# Patient Record
Sex: Female | Born: 1935 | Race: White | Hispanic: No
Health system: Southern US, Community
[De-identification: ages and names within clinical notes are randomized; demographics above are authoritative.]

## PROBLEM LIST (undated history)

## (undated) DIAGNOSIS — R262 Difficulty in walking, not elsewhere classified: Secondary | ICD-10-CM

## (undated) DIAGNOSIS — F329 Major depressive disorder, single episode, unspecified: Secondary | ICD-10-CM

## (undated) DIAGNOSIS — F32A Depression, unspecified: Secondary | ICD-10-CM

## (undated) DIAGNOSIS — K2970 Gastritis, unspecified, without bleeding: Secondary | ICD-10-CM

## (undated) DIAGNOSIS — R627 Adult failure to thrive: Secondary | ICD-10-CM

## (undated) DIAGNOSIS — K219 Gastro-esophageal reflux disease without esophagitis: Secondary | ICD-10-CM

## (undated) DIAGNOSIS — M6281 Muscle weakness (generalized): Secondary | ICD-10-CM

## (undated) DIAGNOSIS — E871 Hypo-osmolality and hyponatremia: Secondary | ICD-10-CM

---

## 2017-01-23 ENCOUNTER — Encounter (HOSPITAL_COMMUNITY): Payer: Self-pay | Admitting: *Deleted

## 2017-01-23 ENCOUNTER — Emergency Department (HOSPITAL_COMMUNITY): Payer: Medicare Other

## 2017-01-23 ENCOUNTER — Inpatient Hospital Stay (HOSPITAL_COMMUNITY)
Admission: EM | Admit: 2017-01-23 | Discharge: 2017-01-28 | DRG: 064 | Disposition: A | Discharge status: Hospice | Payer: Medicare Other | Attending: Family Medicine | Admitting: Family Medicine

## 2017-01-23 DIAGNOSIS — G92 Toxic encephalopathy: Secondary | ICD-10-CM | POA: Diagnosis present

## 2017-01-23 DIAGNOSIS — I639 Cerebral infarction, unspecified: Secondary | ICD-10-CM | POA: Diagnosis present

## 2017-01-23 DIAGNOSIS — R4182 Altered mental status, unspecified: Secondary | ICD-10-CM | POA: Diagnosis present

## 2017-01-23 DIAGNOSIS — F329 Major depressive disorder, single episode, unspecified: Secondary | ICD-10-CM | POA: Diagnosis present

## 2017-01-23 DIAGNOSIS — I63511 Cerebral infarction due to unspecified occlusion or stenosis of right middle cerebral artery: Principal | ICD-10-CM | POA: Diagnosis present

## 2017-01-23 DIAGNOSIS — Z515 Encounter for palliative care: Secondary | ICD-10-CM | POA: Diagnosis present

## 2017-01-23 DIAGNOSIS — Z66 Do not resuscitate: Secondary | ICD-10-CM | POA: Diagnosis present

## 2017-01-23 DIAGNOSIS — F0280 Dementia in other diseases classified elsewhere without behavioral disturbance: Secondary | ICD-10-CM | POA: Diagnosis present

## 2017-01-23 DIAGNOSIS — E7220 Disorder of urea cycle metabolism, unspecified: Secondary | ICD-10-CM | POA: Diagnosis present

## 2017-01-23 DIAGNOSIS — Z823 Family history of stroke: Secondary | ICD-10-CM

## 2017-01-23 DIAGNOSIS — A419 Sepsis, unspecified organism: Secondary | ICD-10-CM | POA: Diagnosis not present

## 2017-01-23 DIAGNOSIS — R627 Adult failure to thrive: Secondary | ICD-10-CM | POA: Diagnosis present

## 2017-01-23 DIAGNOSIS — K219 Gastro-esophageal reflux disease without esophagitis: Secondary | ICD-10-CM | POA: Diagnosis present

## 2017-01-23 DIAGNOSIS — I10 Essential (primary) hypertension: Secondary | ICD-10-CM | POA: Diagnosis present

## 2017-01-23 DIAGNOSIS — F1097 Alcohol use, unspecified with alcohol-induced persisting dementia: Secondary | ICD-10-CM | POA: Diagnosis present

## 2017-01-23 DIAGNOSIS — I63421 Cerebral infarction due to embolism of right anterior cerebral artery: Secondary | ICD-10-CM | POA: Diagnosis not present

## 2017-01-23 HISTORY — DX: Gastro-esophageal reflux disease without esophagitis: K21.9

## 2017-01-23 HISTORY — DX: Adult failure to thrive: R62.7

## 2017-01-23 HISTORY — DX: Major depressive disorder, single episode, unspecified: F32.9

## 2017-01-23 HISTORY — DX: Gastritis, unspecified, without bleeding: K29.70

## 2017-01-23 HISTORY — DX: Difficulty in walking, not elsewhere classified: R26.2

## 2017-01-23 HISTORY — DX: Muscle weakness (generalized): M62.81

## 2017-01-23 HISTORY — DX: Hypo-osmolality and hyponatremia: E87.1

## 2017-01-23 HISTORY — DX: Depression, unspecified: F32.A

## 2017-01-23 LAB — I-STAT CG4 LACTIC ACID, ED
Lactic Acid, Venous: 2.82 mmol/L (ref 0.5–1.9)
Lactic Acid, Venous: 3.73 mmol/L (ref 0.5–1.9)

## 2017-01-23 LAB — CBC WITH DIFFERENTIAL/PLATELET
Basophils Absolute: 0 10*3/uL (ref 0.0–0.1)
Basophils Relative: 0 %
EOS PCT: 0 %
Eosinophils Absolute: 0 10*3/uL (ref 0.0–0.7)
HEMATOCRIT: 32.6 % — AB (ref 36.0–46.0)
Hemoglobin: 10.5 g/dL — ABNORMAL LOW (ref 12.0–15.0)
LYMPHS ABS: 0.8 10*3/uL (ref 0.7–4.0)
Lymphocytes Relative: 4 %
MCH: 21.7 pg — AB (ref 26.0–34.0)
MCHC: 32.2 g/dL (ref 30.0–36.0)
MCV: 67.5 fL — AB (ref 78.0–100.0)
MONO ABS: 1.4 10*3/uL — AB (ref 0.1–1.0)
Monocytes Relative: 7 %
NEUTROS PCT: 89 %
Neutro Abs: 17.1 10*3/uL — ABNORMAL HIGH (ref 1.7–7.7)
PLATELETS: 175 10*3/uL (ref 150–400)
RBC: 4.83 MIL/uL (ref 3.87–5.11)
RDW: 22.4 % — AB (ref 11.5–15.5)
WBC: 19.3 10*3/uL — AB (ref 4.0–10.5)

## 2017-01-23 LAB — I-STAT ARTERIAL BLOOD GAS, ED
Acid-base deficit: 6 mmol/L — ABNORMAL HIGH (ref 0.0–2.0)
Bicarbonate: 16.7 mmol/L — ABNORMAL LOW (ref 20.0–28.0)
O2 SAT: 100 %
PCO2 ART: 26.5 mmHg — AB (ref 32.0–48.0)
PH ART: 7.417 (ref 7.350–7.450)
PO2 ART: 279 mmHg — AB (ref 83.0–108.0)
Patient temperature: 103.4
TCO2: 17 mmol/L — ABNORMAL LOW (ref 22–32)

## 2017-01-23 LAB — I-STAT CHEM 8, ED
BUN: 52 mg/dL — AB (ref 6–20)
CALCIUM ION: 1 mmol/L — AB (ref 1.15–1.40)
CREATININE: 0.8 mg/dL (ref 0.44–1.00)
Chloride: 115 mmol/L — ABNORMAL HIGH (ref 101–111)
Glucose, Bld: 133 mg/dL — ABNORMAL HIGH (ref 65–99)
HCT: 32 % — ABNORMAL LOW (ref 36.0–46.0)
HEMOGLOBIN: 10.9 g/dL — AB (ref 12.0–15.0)
Potassium: 3.7 mmol/L (ref 3.5–5.1)
SODIUM: 144 mmol/L (ref 135–145)
TCO2: 16 mmol/L — AB (ref 22–32)

## 2017-01-23 LAB — AMMONIA: AMMONIA: 54 umol/L — AB (ref 9–35)

## 2017-01-23 LAB — URINALYSIS, ROUTINE W REFLEX MICROSCOPIC
Bilirubin Urine: NEGATIVE
Glucose, UA: NEGATIVE mg/dL
Ketones, ur: NEGATIVE mg/dL
Nitrite: NEGATIVE
SPECIFIC GRAVITY, URINE: 1.018 (ref 1.005–1.030)
pH: 8 (ref 5.0–8.0)

## 2017-01-23 MED ORDER — ONDANSETRON HCL 4 MG/2ML IJ SOLN: 4.0000 mg | Freq: Four times a day (QID) | INTRAMUSCULAR | Status: DC | PRN

## 2017-01-23 MED ORDER — HALOPERIDOL LACTATE 2 MG/ML PO CONC
0.5000 mg | ORAL | Status: DC | PRN
  Filled 2017-01-23: qty 0.3

## 2017-01-23 MED ORDER — ONDANSETRON 4 MG PO TBDP: 4.0000 mg | ORAL_TABLET | Freq: Four times a day (QID) | ORAL | Status: DC | PRN

## 2017-01-23 MED ORDER — VANCOMYCIN HCL IN DEXTROSE 1-5 GM/200ML-% IV SOLN: 1000.0000 mg | INTRAVENOUS | Status: DC

## 2017-01-23 MED ORDER — BIOTENE DRY MOUTH MT LIQD: 15.0000 mL | OROMUCOSAL | Status: DC | PRN

## 2017-01-23 MED ORDER — HALOPERIDOL LACTATE 5 MG/ML IJ SOLN: 0.5000 mg | INTRAMUSCULAR | Status: DC | PRN

## 2017-01-23 MED ORDER — GLYCOPYRROLATE 0.2 MG/ML IJ SOLN: 0.2000 mg | INTRAMUSCULAR | Status: DC | PRN

## 2017-01-23 MED ORDER — SODIUM CHLORIDE 0.9 % IV BOLUS (SEPSIS)
250.0000 mL | Freq: Once | INTRAVENOUS | Status: AC
  Administered 2017-01-23: 250 mL via INTRAVENOUS

## 2017-01-23 MED ORDER — PIPERACILLIN-TAZOBACTAM 3.375 G IVPB
3.3750 g | Freq: Three times a day (TID) | INTRAVENOUS | Status: DC
  Administered 2017-01-23 – 2017-01-24 (×2): 3.375 g via INTRAVENOUS
  Filled 2017-01-23 (×4): qty 50

## 2017-01-23 MED ORDER — ACETAMINOPHEN 325 MG PO TABS: 650.0000 mg | ORAL_TABLET | Freq: Four times a day (QID) | ORAL | Status: DC | PRN

## 2017-01-23 MED ORDER — PIPERACILLIN-TAZOBACTAM 3.375 G IVPB 30 MIN
3.3750 g | Freq: Once | INTRAVENOUS | Status: AC
  Administered 2017-01-23: 3.375 g via INTRAVENOUS
  Filled 2017-01-23: qty 50

## 2017-01-23 MED ORDER — PIPERACILLIN-TAZOBACTAM 3.375 G IVPB: 3.3750 g | Freq: Three times a day (TID) | INTRAVENOUS | Status: DC

## 2017-01-23 MED ORDER — ACETAMINOPHEN 650 MG RE SUPP
650.0000 mg | Freq: Once | RECTAL | Status: AC
  Administered 2017-01-23: 650 mg via RECTAL
  Filled 2017-01-23: qty 1

## 2017-01-23 MED ORDER — ACETAMINOPHEN 650 MG RE SUPP
650.0000 mg | Freq: Four times a day (QID) | RECTAL | Status: DC | PRN
  Administered 2017-01-24: 650 mg via RECTAL
  Filled 2017-01-23: qty 1

## 2017-01-23 MED ORDER — HALOPERIDOL 1 MG PO TABS: 0.5000 mg | ORAL_TABLET | ORAL | Status: DC | PRN

## 2017-01-23 MED ORDER — SODIUM CHLORIDE 0.9 % IV BOLUS (SEPSIS)
500.0000 mL | Freq: Once | INTRAVENOUS | Status: AC
  Administered 2017-01-23: 500 mL via INTRAVENOUS

## 2017-01-23 MED ORDER — VANCOMYCIN HCL IN DEXTROSE 1-5 GM/200ML-% IV SOLN
1000.0000 mg | Freq: Once | INTRAVENOUS | Status: AC
  Administered 2017-01-23: 1000 mg via INTRAVENOUS
  Filled 2017-01-23: qty 200

## 2017-01-23 MED ORDER — POLYVINYL ALCOHOL 1.4 % OP SOLN
1.0000 [drp] | Freq: Four times a day (QID) | OPHTHALMIC | Status: DC | PRN
  Filled 2017-01-23: qty 15

## 2017-01-23 MED ORDER — SODIUM CHLORIDE 0.9 % IV BOLUS (SEPSIS)
1000.0000 mL | Freq: Once | INTRAVENOUS | Status: AC
  Administered 2017-01-23: 1000 mL via INTRAVENOUS

## 2017-01-23 MED ORDER — GLYCOPYRROLATE 1 MG PO TABS
1.0000 mg | ORAL_TABLET | ORAL | Status: DC | PRN
  Filled 2017-01-23: qty 1

## 2017-01-23 NOTE — Consult Note (Signed)
Neurology Consultation Reason for Consult: Stroke Referring Physician: Corlis Leak, C  CC: Altered mental status  History is obtained from: Referring provider's  HPI: Wanda Manning is a 83 y.o. female with large ICA stroke. History is limited due to patient's altered mental status. I spoke with her son who states that her quality of life baseline is very low, and that she has been losing strength because of inactivity. She was found to be confused at the nursing home and therefore brought in for further evaluation. On arrival, she was obtunded, tachycardic, suspected sepsis. A head CT was obtained which shows a large ICA infarct on the right.   LKW: Unclear tpa given?: no, unclear time of onset   ROS:  Unable to obtain due to altered mental status.   Past Medical History:  Diagnosis Date  . Depression   . Difficulty in walking   . Failure to thrive in adult   . Gastritis   . GERD (gastroesophageal reflux disease)   . Hyponatremia   . Muscle weakness      Family history: Unable to obtain due to AMS   Social History:  has no tobacco, alcohol, and drug history on file.   Exam: Current vital signs: BP (!) 142/91 (BP Location: Right Arm)   Pulse (!) 173   Temp (!) 103.4 F (39.7 C) (Rectal)   Resp (!) 29   Wt 48.1 kg (106 lb)   SpO2 100%  Vital signs in last 24 hours: Temp:  [103.4 F (39.7 C)] 103.4 F (39.7 C) (09/26 1510) Pulse Rate:  [140-173] 173 (09/26 1628) Resp:  [20-40] 29 (09/26 1628) BP: (119-156)/(64-102) 142/91 (09/26 1628) SpO2:  [100 %] 100 % (09/26 1628) Weight:  [48.1 kg (106 lb)] 48.1 kg (106 lb) (09/26 1606)   Physical Exam  Constitutional: Appears  Psych: Affect appropriate to situation Eyes: No scleral injection HENT: Nonrebreather in place Head: Normocephalic.  Cardiovascular: Normal rate and regular rhythm.  Respiratory: Tachypnea GI: Soft.  No distension. There is no tenderness.  Skin: WDI  Neuro: Mental Status: Patient is obtunded, she  does not follow commands. She does open her eyes to noxious stimuli Cranial Nerves: II: She does not blink to threat, right pupil is larger than her left, but both are reactive III,IV, VI: She does not cross midline to the left V: Facial sensation is symmetric to temperature VII: Facial movement with some left weakness VIII: hearing is intact to voice X: Uvula elevates symmetrically XI: Shoulder shrug is symmetric. XII: tongue is midline without atrophy or fasciculations.  Motor: She moves her right side purposefully and well, she has markedly increased tone throughout the left side, but does flex to noxious stimuli Sensory: She response to noxious stimuli in all 4 extremities Cerebellar: Does not perform   I have reviewed labs in epic and the results pertinent to this consultation are: Leukocytosis  I have reviewed the images obtained: CT head-large right ICA infarct  Impression: 83 year old female with dementia and poor quality of life prior to presentation who presents with devastating right ICA stroke. Though the stroke is large, given the degree of atrophy, it is possible that she will not herniate due to this. I discussed with the son that even if she were to survive this, her quality of life would be dramatically reduced to which she responded that her quality of life was not very good even prior.  Given this, discussion was had with both the son and daughter who agree that they wanted  to focus only on comfort care and not pursue any life prolonging measures.  Recommendations: 1) comfort only care 2)  Please call f neurology can be of further assistance   Ritta Slot, MD [facility] [phone]  If 7pm- 7am, please page neurology on call as listed in AMION.

## 2017-01-23 NOTE — ED Notes (Signed)
EDP speaking with family on phone.  Family choosing not to intubate and not to resuscitate.

## 2017-01-23 NOTE — ED Notes (Signed)
Meal tray ordered 

## 2017-01-23 NOTE — ED Notes (Signed)
Rep;ort given to rn on 5c 

## 2017-01-23 NOTE — ED Triage Notes (Signed)
Pt to ED via [facility] from [place] for altered mental status.  Pt with dementia but normally responsive.  Found unresponsive by staff today.  LSN yesterday.  Given 500 ns bolus en-route for hr of 150.  Hr 140 st, rr 40, bp 134/93, sats 100% nrb.  103.4 rectal temp.

## 2017-01-23 NOTE — ED Provider Notes (Signed)
[facility]-EMERGENCY DEPT Provider Note   CSN: [id number] Arrival date & time: 01/23/17  1502     History   Chief Complaint Chief Complaint  Patient presents with  . Altered Mental Status    HPI Wanda Manning is a 83 y.o. female.  The history is provided by the EMS personnel and medical records. No language interpreter was used.    83 year old female history of dementia and spasticity who presents via EMS from living facility for altered mental status.  Last seen at baseline yesterday. Baseline reportedly able to interact and answer some questions. History obtained from EMS. Patient minimally responsive today.  Glucose 179. Tachycardic 150s at nursing facility. Febrile 103.31F on arrival. Tachypneic though SpO2>90% on room air at facility.   Past Medical History:  Diagnosis Date  . Depression   . Difficulty in walking   . Failure to thrive in adult   . Gastritis   . GERD (gastroesophageal reflux disease)   . Hyponatremia   . Muscle weakness     Patient Active Problem List   Diagnosis Date Noted  . Stroke (cerebrum) (HCC) 01/23/2017    No past surgical history on file.  OB History    No data available       Home Medications    Prior to Admission medications   Medication Sig Start Date End Date Taking? Authorizing Provider  acetaminophen (TYLENOL) 325 MG tablet Take 650 mg by mouth every 4 (four) hours as needed for mild pain.   Yes [provider]  Amino Acids-Protein Hydrolys (FEEDING SUPPLEMENT, PRO-STAT SUGAR FREE 64,) LIQD Take 30 mLs by mouth 2 (two) times daily.   Yes [provider]  Cholecalciferol (VITAMIN D) 2000 units tablet Take 2,000 Units by mouth daily.   Yes [provider]  omeprazole (PRILOSEC) 20 MG capsule Take 20 mg by mouth daily.   Yes [provider]  vitamin B-12 (CYANOCOBALAMIN) 1000 MCG tablet Take 1,000 mcg by mouth daily.   Yes [provider]    Family History No family history on  file.  Social History Social History  Substance Use Topics  . Smoking status: Not on file  . Smokeless tobacco: Not on file  . Alcohol use Not on file     Allergies   Patient has no allergy information on record.   Review of Systems Review of Systems  Unable to perform ROS: Patient nonverbal     Physical Exam Updated Vital Signs BP (!) 151/75   Pulse (!) 37   Temp (!) 103.4 F (39.7 C) (Rectal)   Resp (!) 33   Wt 48.1 kg (106 lb)   SpO2 (!) 82%   Physical Exam  Constitutional: She appears well-developed. She appears toxic. She has a sickly appearance. She appears ill.  HENT:  Head: Normocephalic and atraumatic.  Eyes:  Slight anisocoria. R pupil 3mm, L pupil 2mm. Reactive to light bilaterally  Neck: Neck supple.  Cardiovascular: An irregularly irregular rhythm present. Tachycardia present.   No murmur heard. Pulmonary/Chest: Tachypnea noted. She is in respiratory distress.  Agonal respirations  Abdominal: Soft. There is no tenderness.  Musculoskeletal: She exhibits no edema.  Neurological:  Unresponsive to commands or pain  Skin: Skin is warm.  Nursing note and vitals reviewed.    ED Treatments / Results  Labs (all labs ordered are listed, but only abnormal results are displayed) Labs Reviewed  CBC WITH DIFFERENTIAL/PLATELET - Abnormal; Notable for the following:       Result Value  WBC 19.3 (*)    Hemoglobin 10.5 (*)    HCT 32.6 (*)    MCV 67.5 (*)    MCH 21.7 (*)    RDW 22.4 (*)    Neutro Abs 17.1 (*)    Monocytes Absolute 1.4 (*)    All other components within normal limits  URINALYSIS, ROUTINE W REFLEX MICROSCOPIC - Abnormal; Notable for the following:    Color, Urine AMBER (*)    APPearance CLOUDY (*)    Hgb urine dipstick SMALL (*)    Protein, ur >=300 (*)    Leukocytes, UA LARGE (*)    Bacteria, UA MANY (*)    Squamous Epithelial / LPF 6-30 (*)    All other components within normal limits  AMMONIA - Abnormal; Notable for the  following:    Ammonia 54 (*)    All other components within normal limits  I-STAT CG4 LACTIC ACID, ED - Abnormal; Notable for the following:    Lactic Acid, Venous 2.82 (*)    All other components within normal limits  I-STAT ARTERIAL BLOOD GAS, ED - Abnormal; Notable for the following:    pCO2 arterial 26.5 (*)    pO2, Arterial 279.0 (*)    Bicarbonate 16.7 (*)    TCO2 17 (*)    Acid-base deficit 6.0 (*)    All other components within normal limits  I-STAT CHEM 8, ED - Abnormal; Notable for the following:    Chloride 115 (*)    BUN 52 (*)    Glucose, Bld 133 (*)    Calcium, Ion 1.00 (*)    TCO2 16 (*)    Hemoglobin 10.9 (*)    HCT 32.0 (*)    All other components within normal limits  I-STAT CG4 LACTIC ACID, ED - Abnormal; Notable for the following:    Lactic Acid, Venous 3.73 (*)    All other components within normal limits  CULTURE, BLOOD (ROUTINE X 2)  CULTURE, BLOOD (ROUTINE X 2)  URINE CULTURE    EKG  EKG Interpretation None       Radiology Ct Head Wo Contrast  Addendum Date: 01/23/2017   ADDENDUM REPORT: 01/23/2017 16:23 ADDENDUM: Critical Value/emergent results were called by telephone at the time of interpretation on 01/23/2017 at 4:20 pm to Dr. Homero Fellers Veeda Virgo , who verbally acknowledged these results. Electronically Signed   By: Awilda Metro M.D.   On: 01/23/2017 16:23   Result Date: 01/23/2017 CLINICAL DATA:  Altered mental status. EXAM: CT HEAD WITHOUT CONTRAST TECHNIQUE: Contiguous axial images were obtained from the base of the skull through the vertex without intravenous contrast. COMPARISON:  None. FINDINGS: BRAIN: Blurring of RIGHT frontotemporal in mesial frontoparietal gray-white matter junction with hypodense RIGHT basal ganglia and generalized RIGHT cerebrum sulcal effacement. Confluent LEFT supratentorial white matter hypodensities. Old LEFT basal ganglia and LEFT thalamus lacunar infarcts. Probable old RIGHT thalamus lacunar infarct. No intraparenchymal  hemorrhage, midline shift. No abnormal extra-axial fluid collections. Basal cisterns are patent. VASCULAR: Dense RIGHT MCA. Moderate calcific atherosclerosis carotid siphons. SKULL: No skull fracture. Severe temporomandibular osteoarthrosis. No significant scalp soft tissue swelling. SINUSES/ORBITS: The mastoid air-cells and included paranasal sinuses are well-aerated.The included ocular globes and orbital contents are non-suspicious. Status post bilateral ocular lens implants. OTHER: Patient is edentulous. IMPRESSION: 1. Acute large RIGHT MCA and RIGHT ACA territory nonhemorrhagic infarct. Findings likely secondary to acute RIGHT internal carotid artery occlusion. 2. Severe chronic small vessel ischemic disease and old lacunar infarcts. Electronically Signed: By: Michel Santee.D.  On: 01/23/2017 16:17   Dg Chest Port 1 View  Result Date: 01/23/2017 CLINICAL DATA:  Altered mental status. EXAM: PORTABLE CHEST 1 VIEW COMPARISON:  None. FINDINGS: The mediastinal contour is normal. The aorta is tortuous. Heart size is enlarged. There is no focal infiltrate, pulmonary edema, or pleural effusion. No acute bone abnormality is identified IMPRESSION: Cardiomegaly.  Lungs are clear. Electronically Signed   By: Sherian Rein M.D.   On: 01/23/2017 15:45    Procedures Procedures (including critical care time)  Medications Ordered in ED Medications  acetaminophen (TYLENOL) tablet 650 mg (not administered)    Or  acetaminophen (TYLENOL) suppository 650 mg (not administered)  haloperidol (HALDOL) tablet 0.5 mg (not administered)    Or  haloperidol (HALDOL) 2 MG/ML solution 0.5 mg (not administered)    Or  haloperidol lactate (HALDOL) injection 0.5 mg (not administered)  ondansetron (ZOFRAN-ODT) disintegrating tablet 4 mg (not administered)    Or  ondansetron (ZOFRAN) injection 4 mg (not administered)  glycopyrrolate (ROBINUL) tablet 1 mg (not administered)    Or  glycopyrrolate (ROBINUL) injection 0.2  mg (not administered)    Or  glycopyrrolate (ROBINUL) injection 0.2 mg (not administered)  antiseptic oral rinse (BIOTENE) solution 15 mL (not administered)  polyvinyl alcohol (LIQUIFILM TEARS) 1.4 % ophthalmic solution 1 drop (not administered)  vancomycin (VANCOCIN) IVPB 1000 mg/200 mL premix (0 mg Intravenous Stopped 01/23/17 2017)  piperacillin-tazobactam (ZOSYN) IVPB 3.375 g (3.375 g Intravenous New Bag/Given 01/23/17 2245)  sodium chloride 0.9 % bolus 1,000 mL (0 mLs Intravenous Stopped 01/23/17 1621)    And  sodium chloride 0.9 % bolus 500 mL (0 mLs Intravenous Stopped 01/23/17 1636)    And  sodium chloride 0.9 % bolus 250 mL (0 mLs Intravenous Stopped 01/23/17 1645)  piperacillin-tazobactam (ZOSYN) IVPB 3.375 g (0 g Intravenous Stopped 01/23/17 1620)  vancomycin (VANCOCIN) IVPB 1000 mg/200 mL premix (0 mg Intravenous Stopped 01/23/17 1658)  acetaminophen (TYLENOL) suppository 650 mg (650 mg Rectal Given 01/23/17 1626)     Initial Impression / Assessment and Plan / ED Course  I have reviewed the triage vital signs and the nursing notes.  Pertinent labs & imaging results that were available during my care of the patient were reviewed by me and considered in my medical decision making (see chart for details).     62 yoF who presents from living facilty for AMS. Patient somnolent with GCS 5 on arrival.  Little to no response to painful stimuli. Flexion contractures. Concern for airway protection given poor neurologic status and agonal breathing pattern. Dr. Corlis Leak, ED attending, spoke with patient's daughter by phone. Daughter understanding of situation and agrees patient should be DNR/DNI. She states brother is on way to hospital.   Febrile 103.4, tachycardic 150s-160s, BP stable. Code sepsis labs sent. Vanc/zosyn and IVF ordered.   CT Head showing R MCA/ACA infarct possibly from ICA occlusion. Neurology consulted and evaluated pt. Pt's daughter updated on findings by phone. Pt's son also  arrived. Son updated on findings including devastating nature of stroke. Daughter and son electing to pursue comfort care measures. Inpatient team consulted for admission.  Pt care d/w Dr. Corlis Leak  Final Clinical Impressions(s) / ED Diagnoses   Final diagnoses:  Large vessel stroke (HCC)  Sepsis, due to unspecified organism [facility])    New Prescriptions Current Discharge Medication List       Hebert Soho, MD 01/24/17 0225    Abelino Derrick, MD 01/24/17 [phone]

## 2017-01-23 NOTE — ED Notes (Signed)
Dr. Corlis Leak given lactic acid result

## 2017-01-23 NOTE — Progress Notes (Signed)
Pharmacy Antibiotic Note  Wanda Manning is a 83 y.o. female admitted on 01/23/2017 with AMS. CT head showed L ICA infarct. Broad spectrum antibiotics have been ordered for possible sepsis while patient's goals of care are being evaluated. eCrCl < 30 ml/min.   Plan: -Vancomycin 1 g IV x1 then 1 g IV q48h -Zosyn 3.375 g IV q8h -Monitor renal fx, cultures, GOC  -May have to dose off of vanc random's     Temp (24hrs), Avg:103.4 F (39.7 C), Min:103.4 F (39.7 C), Max:103.4 F (39.7 C)  No results for input(s): WBC, CREATININE, LATICACIDVEN, VANCOTROUGH, VANCOPEAK, VANCORANDOM, GENTTROUGH, GENTPEAK, GENTRANDOM, TOBRATROUGH, TOBRAPEAK, TOBRARND, AMIKACINPEAK, AMIKACINTROU, AMIKACIN in the last 168 hours.  CrCl cannot be calculated (No order found.).    Allergies not on file  Antimicrobials this admission: 9/26 vancomycin > 9/26 zosyn >   Dose adjustments this admission: N/A   Microbiology results: 9/26 blood cx: 9/26 urine cx:    Wanda Manning 01/23/2017 3:17 PM
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
## 2017-01-23 NOTE — ED Notes (Signed)
Attempted report 

## 2017-01-24 ENCOUNTER — Encounter (HOSPITAL_COMMUNITY): Payer: Self-pay

## 2017-01-24 DIAGNOSIS — I639 Cerebral infarction, unspecified: Secondary | ICD-10-CM

## 2017-01-24 LAB — URINE CULTURE

## 2017-01-24 MED ORDER — MORPHINE SULFATE (CONCENTRATE) 10 MG/0.5ML PO SOLN
10.0000 mg | ORAL | Status: DC | PRN
  Administered 2017-01-24 – 2017-01-28 (×8): 10 mg via SUBLINGUAL
  Filled 2017-01-24 (×8): qty 0.5
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
## 2017-01-24 NOTE — Progress Notes (Signed)
Nutrition Brief Note  Pt identified on the Malnutrition Screening Tool Report. S/p large ICA stroke. Admitted under End of Life order set. No nutrition interventions warranted at this time.  Please consult as needed.   Maureen Chatters, RD, LDN Pager #: [phone] After-Hours Pager #: [phone]

## 2017-01-24 NOTE — Progress Notes (Signed)
PROGRESS NOTE    Wanda Manning  RUE:[id number] DOB: [birth year] DOA: 01/23/2017 PCP: Crista Curb, MD   Specialists:     Brief Narrative:  83 year old female Prior heavy alcohol use ? Hypertension Admitted with loss of consciousness acute large right MCA, right ACA and fevers  Made DO NOT RESUSCITATE and is currently comfort care  Assessment & Plan:   Active Problems:   Stroke (cerebrum) (HCC) stroke is debilitating and patient is comfort care now I have added Roxanol for work of breathing as right now she is breathing about 40 times a minute and is tachycardic If she survives overnight we will plan on placement to [facility] place, otherwise fact anticipate this might be a hospital death later on today   DVT prophylaxis: none as his comfort care Code Status: DO NOT RESUSCITATE Family Communication: long discussion with son at bedside and discussed with Child psychotherapist   Consultants:   None yet  Procedures:   none  Antimicrobials:   none    Subjective: unarousable and visibly tachypneic with accessory muscle use Cannot obtain ROS  Objective: Vitals:   01/23/17 1845 01/24/17 0226 01/24/17 0512 01/24/17 1000  BP:  105/74 115/81 113/75  Pulse: (!) 37 (!) 38 (!) 43 75  Resp: (!) 33 Temp:  99.3 F (37.4 C)  (!) 101.1 F (38.4 C)  TempSrc:  Axillary  Axillary  SpO2: (!) 82% 100% 100% 100%  Weight:        Intake/Output Summary (Last 24 hours) at 01/24/17 1504 Last data filed at 01/24/17 1000  Gross per 24 hour  Intake             1850 ml  Output                0 ml  Net             1850 ml   Filed Weights   01/23/17 1606  Weight: 48.1 kg (106 lb)    Examination:  Ill-appearing frail Pupils are weakly reactive 3 mm Tachycardic about 150 Chest relatively clear No lower extremity edema  Data Reviewed: I have personally reviewed following labs and imaging studies  CBC:  Recent Labs Lab 01/23/17 1514 01/23/17 1947  WBC 19.3*  --     NEUTROABS 17.1*  --   HGB 10.5* 10.9*  HCT 32.6* 32.0*  MCV 67.5*  --   PLT 175  --    Basic Metabolic Panel:  Recent Labs Lab 01/23/17 1947  NA 144  K 3.7  CL 115*  GLUCOSE 133*  BUN 52*  CREATININE 0.80   GFR: CrCl cannot be calculated (Unknown ideal weight.). Liver Function Tests: No results for input(s): AST, ALT, ALKPHOS, BILITOT, PROT, ALBUMIN in the last 168 hours. No results for input(s): LIPASE, AMYLASE in the last 168 hours.  Recent Labs Lab 01/23/17 1611  AMMONIA 54*   Coagulation Profile: No results for input(s): INR, PROTIME in the last 168 hours. Cardiac Enzymes: No results for input(s): CKTOTAL, CKMB, CKMBINDEX, TROPONINI in the last 168 hours. BNP (last 3 results) No results for input(s): PROBNP in the last 8760 hours. HbA1C: No results for input(s): HGBA1C in the last 72 hours. CBG: No results for input(s): GLUCAP in the last 168 hours. Lipid Profile: No results for input(s): CHOL, HDL, LDLCALC, TRIG, CHOLHDL, LDLDIRECT in the last 72 hours. Thyroid Function Tests: No results for input(s): TSH, T4TOTAL, FREET4, T3FREE, THYROIDAB in the last 72 hours. Anemia Panel: No results  for input(s): VITAMINB12, FOLATE, FERRITIN, TIBC, IRON, RETICCTPCT in the last 72 hours. Urine analysis:    Component Value Date/Time   COLORURINE AMBER (A) 01/23/2017 1643   APPEARANCEUR CLOUDY (A) 01/23/2017 1643   LABSPEC 1.018 01/23/2017 1643   PHURINE 8.0 01/23/2017 1643   GLUCOSEU NEGATIVE 01/23/2017 1643   HGBUR SMALL (A) 01/23/2017 1643   BILIRUBINUR NEGATIVE 01/23/2017 1643   KETONESUR NEGATIVE 01/23/2017 1643   PROTEINUR >=300 (A) 01/23/2017 1643   NITRITE NEGATIVE 01/23/2017 1643   LEUKOCYTESUR LARGE (A) 01/23/2017 1643     Radiology Studies: Reviewed images personally in health database    Scheduled Meds: Continuous Infusions:   LOS: 1 day    Time spent: 74    Pleas Koch, MD [facility] (P) [phone]   If 7PM-7AM, please contact  night-coverage [email or web address] Password [facility] 01/24/2017, 3:04 PM

## 2017-01-24 NOTE — Progress Notes (Signed)
Skin assessment done by this nurse with the assistance of Habeeb NT, stage one sacral ulcer noted. Mepilex dressing applied.
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
## 2017-01-25 ENCOUNTER — Encounter (HOSPITAL_COMMUNITY): Payer: Self-pay | Admitting: General Practice

## 2017-01-25 NOTE — Progress Notes (Signed)
Pt on NRP in NAD. Comfort care. Son leaving for the night. Will update on patients condition if anything changed.

## 2017-01-25 NOTE — Progress Notes (Signed)
PROGRESS NOTE    Wanda Manning  [id number]:[id number] DOB: 06-Oct-1927 DOA: 01/23/2017 PCP: Crista Curb, MD   Specialists:     Brief Narrative:  83 year old female Prior heavy alcohol use ? Hypertension Admitted with loss of consciousness acute large right MCA, right ACA and fevers  Made DO NOT RESUSCITATE and is currently comfort care  Assessment & Plan:   Active Problems:   Stroke (cerebrum) (HCC) stroke is debilitating and patient is comfort care now Work of breathing is more stable with Roxanol She looks less uncomfortable, but I would not be surprised if she passes away within the next 24 hours as her lower extremities have started to become cold I had a long discussion with her son who is a veteran help the initial [place] and he understands the process of death and dying and suffering to some extent We had a good discussion    DVT prophylaxis: none as his comfort care Code Status: DO NOT RESUSCITATE Family Communication: son at bedside   Consultants:   None yet  Procedures:   none  Antimicrobials:   none    Subjective:  unarousable  rested peacefully  according to nursing   Objective: Vitals:   01/24/17 0226 01/24/17 0512 01/24/17 1000 01/25/17 0815  BP: 105/74 115/81 113/75 124/70  Pulse: (!) 38 (!) 43 75 (!) 113  Resp: (!) 24  Temp: 99.3 F (37.4 C)  (!) 101.1 F (38.4 C) 99.1 F (37.3 C)  TempSrc: Axillary  Axillary Axillary  SpO2: 100% 100% 100% 100%  Weight:        Intake/Output Summary (Last 24 hours) at 01/25/17 1802 Last data filed at 01/25/17 1500  Gross per 24 hour  Intake                0 ml  Output                0 ml  Net                0 ml   Filed Weights   01/23/17 1606  Weight: 48.1 kg (106 lb)    Examination:  Ill-appearing frail chest rises slow Heart rate has slowed Extremities are cold and blue  Data Reviewed: I have personally reviewed following labs and imaging studies  CBC:  Recent Labs Lab  01/23/17 1514 01/23/17 1947  WBC 19.3*  --   NEUTROABS 17.1*  --   HGB 10.5* 10.9*  HCT 32.6* 32.0*  MCV 67.5*  --   PLT 175  --    Basic Metabolic Panel:  Recent Labs Lab 01/23/17 1947  NA 144  K 3.7  CL 115*  GLUCOSE 133*  BUN 52*  CREATININE 0.80   GFR: CrCl cannot be calculated (Unknown ideal weight.). Liver Function Tests: No results for input(s): AST, ALT, ALKPHOS, BILITOT, PROT, ALBUMIN in the last 168 hours. No results for input(s): LIPASE, AMYLASE in the last 168 hours.  Recent Labs Lab 01/23/17 1611  AMMONIA 54*   Coagulation Profile: No results for input(s): INR, PROTIME in the last 168 hours. Cardiac Enzymes: No results for input(s): CKTOTAL, CKMB, CKMBINDEX, TROPONINI in the last 168 hours. BNP (last 3 results) No results for input(s): PROBNP in the last 8760 hours. HbA1C: No results for input(s): HGBA1C in the last 72 hours. CBG: No results for input(s): GLUCAP in the last 168 hours. Lipid Profile: No results for input(s): CHOL, HDL, LDLCALC, TRIG, CHOLHDL, LDLDIRECT in the last 72 hours. Thyroid  Function Tests: No results for input(s): TSH, T4TOTAL, FREET4, T3FREE, THYROIDAB in the last 72 hours. Anemia Panel: No results for input(s): VITAMINB12, FOLATE, FERRITIN, TIBC, IRON, RETICCTPCT in the last 72 hours. Urine analysis:    Component Value Date/Time   COLORURINE AMBER (A) 01/23/2017 1643   APPEARANCEUR CLOUDY (A) 01/23/2017 1643   LABSPEC 1.018 01/23/2017 1643   PHURINE 8.0 01/23/2017 1643   GLUCOSEU NEGATIVE 01/23/2017 1643   HGBUR SMALL (A) 01/23/2017 1643   BILIRUBINUR NEGATIVE 01/23/2017 1643   KETONESUR NEGATIVE 01/23/2017 1643   PROTEINUR >=300 (A) 01/23/2017 1643   NITRITE NEGATIVE 01/23/2017 1643   LEUKOCYTESUR LARGE (A) 01/23/2017 1643     Radiology Studies: Reviewed images personally in health database    Scheduled Meds: Continuous Infusions:   LOS: 2 days    Time spent: 75    Pleas Koch, MD [facility] (P) [phone]   If 7PM-7AM, please contact night-coverage [email or web address] Password [facility] 01/25/2017, 6:02 PM

## 2017-01-25 NOTE — Care Management Note (Signed)
Case Management Note  Patient Details  Name: Marsella Suman MRN: [id number] Date of Birth: [birth year]  Subjective/Objective:                    Action/Plan: Plan is for hospital death. No further needs per CM.   Expected Discharge Date:                  Expected Discharge Plan:  Hospice Medical Facility  In-House Referral:  Clinical Social Work  Discharge planning Services     Post Acute Care Choice:    Choice offered to:     DME Arranged:    DME Agency:     HH Arranged:    HH Agency:     Status of Service:  Completed, signed off  If discussed at [employer], dates discussed:    Additional Comments:  Kermit Balo, RN 01/25/2017, 2:21 PM

## 2017-01-26 NOTE — Progress Notes (Signed)
PROGRESS NOTE    Wanda Manning  ZOX:[id number] DOB: [birth year] DOA: 01/23/2017 PCP: Crista Curb, MD   Brief Narrative: 83 year old female with history of end-stage dementia admitted from a nursing home facility with unresponsiveness. CT of the head showed acute large right MCA nonhemorrhagic infarct secondary to possible right ICA occlusion neurology saw the patient and recommended hospice care. Patient appears to be comfortable at this time on nonrebreather mask.   Assessment & Plan:   Active Problems:   Stroke (cerebrum) (HCC)  Large right MCA infarct continue comfort care and hospice consult.  DVT prophylaxis: none Code Status: DO NOT RESUSCITATE Family Communication:  Disposition Plan: tbd  Consultants: neuro  Procedures: none  Antimicrobials:  none   Subjective:resting in nad. Objective: Vitals:   01/25/17 2052 01/26/17 0145 01/26/17 0638 01/26/17 0832  BP: 122/78  (!) 117/59 115/76  Pulse: (!) 105 100 93 94  Resp: (!) Temp: 99.2 F (37.3 C)  99.4 F (37.4 C) 97.7 F (36.5 C)  TempSrc: Axillary  Axillary Axillary  SpO2: (!) 85%  (!) 69%   Weight:       No intake or output data in the 24 hours ending 01/26/17 1540 Filed Weights   01/23/17 1606  Weight: 48.1 kg (106 lb)    Examination:  General exam: Appears calm and comfortable  Respiratory system: Clear to auscultation. Respiratory effort normal. Cardiovascular system: S1 & S2 heard, RRR. No JVD, murmurs, rubs, gallops or clicks. No pedal edema. Gastrointestinal system: Abdomen is nondistended, soft and nontender. No organomegaly or masses felt. Normal bowel sounds heard. Central nervous system: focal neurologi Skin: No rashes, lesions or ulcers Psychiatry: Judgement and insight appear normal. Mood & affect appropriate.     Data Reviewed: I have personally reviewed following labs and imaging studies  CBC:  Recent Labs Lab 01/23/17 1514 01/23/17 1947  WBC 19.3*  --   NEUTROABS  17.1*  --   HGB 10.5* 10.9*  HCT 32.6* 32.0*  MCV 67.5*  --   PLT 175  --    Basic Metabolic Panel:  Recent Labs Lab 01/23/17 1947  NA 144  K 3.7  CL 115*  GLUCOSE 133*  BUN 52*  CREATININE 0.80   GFR: CrCl cannot be calculated (Unknown ideal weight.). Liver Function Tests: No results for input(s): AST, ALT, ALKPHOS, BILITOT, PROT, ALBUMIN in the last 168 hours. No results for input(s): LIPASE, AMYLASE in the last 168 hours.  Recent Labs Lab 01/23/17 1611  AMMONIA 54*   Coagulation Profile: No results for input(s): INR, PROTIME in the last 168 hours. Cardiac Enzymes: No results for input(s): CKTOTAL, CKMB, CKMBINDEX, TROPONINI in the last 168 hours. BNP (last 3 results) No results for input(s): PROBNP in the last 8760 hours. HbA1C: No results for input(s): HGBA1C in the last 72 hours. CBG: No results for input(s): GLUCAP in the last 168 hours. Lipid Profile: No results for input(s): CHOL, HDL, LDLCALC, TRIG, CHOLHDL, LDLDIRECT in the last 72 hours. Thyroid Function Tests: No results for input(s): TSH, T4TOTAL, FREET4, T3FREE, THYROIDAB in the last 72 hours. Anemia Panel: No results for input(s): VITAMINB12, FOLATE, FERRITIN, TIBC, IRON, RETICCTPCT in the last 72 hours. Sepsis Labs:  Recent Labs Lab 01/23/17 1520 01/23/17 1947  LATICACIDVEN 2.82* 3.73*    Recent Results (from the past 240 hour(s))  Blood Culture (routine x 2)     Status: None (Preliminary result)   Collection Time: 01/23/17  3:10 PM  Result Value Ref Range  Status   Specimen Description BLOOD BLOOD RIGHT FOREARM  Final   Special Requests IN PEDIATRIC BOTTLE Blood Culture adequate volume  Final   Culture NO GROWTH 3 DAYS  Final   Report Status PENDING  Incomplete  Blood Culture (routine x 2)     Status: None (Preliminary result)   Collection Time: 01/23/17  3:35 PM  Result Value Ref Range Status   Specimen Description BLOOD LEFT HAND  Final   Special Requests   Final    BOTTLES DRAWN  AEROBIC ONLY Blood Culture adequate volume   Culture NO GROWTH 3 DAYS  Final   Report Status PENDING  Incomplete  Urine culture     Status: Abnormal   Collection Time: 01/23/17  4:43 PM  Result Value Ref Range Status   Specimen Description URINE, CATHETERIZED  Final   Special Requests NONE  Final   Culture MULTIPLE SPECIES PRESENT, SUGGEST RECOLLECTION (A)  Final   Report Status 01/24/2017 FINAL  Final         Radiology Studies: No results found.      Scheduled Meds: Continuous Infusions:   LOS: 3 days     Alwyn Ren, MD [facility]   If 7PM-7AM, please contact night-coverage [email or web address] Password [email or web address] 01/26/2017, 3:40 PM

## 2017-01-26 NOTE — Progress Notes (Signed)
Pt comfort care. Turned and repositioned frequently. No family at bedside at this time.

## 2017-01-27 NOTE — Progress Notes (Signed)
PROGRESS NOTE    Wanda Manning  ONG:[id number] DOB: [birth year] DOA: 01/23/2017 PCP: Crista Curb, MD   Brief Narrative:83 year old female with history of end-stage dementia admitted from a nursing home facility with unresponsiveness. CT of the head showed acute large right MCA nonhemorrhagic infarct secondary to possible right ICA occlusion neurology saw the patient and recommended hospice care. Patient appears to be comfortable at this time on nonrebreather mask.   Assessment & Plan:   Active Problems:   Stroke (cerebrum) (HCC)  Large right MCA infarct continue comfort care and hospice consult.dc oxygen    DVT prophylaxis: Code Status Family Communication: Disposition Plan  Consultants:   Procedures:  Antimicrobials   Subjective lying in bed.appear comfortable.  Objective: Vitals:   01/26/17 1709 01/26/17 2152 01/27/17 0433 01/27/17 0830  BP: 107/65 (!) 89/54 (!) 113/48 131/71  Pulse: (!) 106 67 83 89  Resp: Temp: (!) 97.5 F (36.4 C) 97.6 F (36.4 C) 97.6 F (36.4 C) 97.8 F (36.6 C)  TempSrc: Axillary Axillary Axillary Axillary  SpO2: 93% (!) 74% (!) 82%   Weight:        Intake/Output Summary (Last 24 hours) at 01/27/17 1340 Last data filed at 01/26/17 1700  Gross per 24 hour  Intake                0 ml  Output              200 ml  Net             -200 ml   Filed Weights   01/23/17 1606  Weight: 48.1 kg (106 lb)    Examination:  General exam: Appears calm and comfortable  Respiratory system: Clear to auscultation. Respiratory effort normal. Cardiovascular system: S1 & S2 heard, RRR. No JVD, murmurs, rubs, gallops or clicks. No pedal edema. Gastrointestinal system: Abdomen is nondistended, soft and nontender. No organomegaly or masses felt. Normal bowel sounds heard. Central nervous system: Alert and oriented. No focal neurological deficits. Extremities: Symmetric 5 x 5 power. Skin: No rashes, lesions or ulcers Psychiatry:  Judgement and insight appear normal. Mood & affect appropriate.     Data Reviewed: I have personally reviewed following labs and imaging studies  CBC:  Recent Labs Lab 01/23/17 1514 01/23/17 1947  WBC 19.3*  --   NEUTROABS 17.1*  --   HGB 10.5* 10.9*  HCT 32.6* 32.0*  MCV 67.5*  --   PLT 175  --    Basic Metabolic Panel:  Recent Labs Lab 01/23/17 1947  NA 144  K 3.7  CL 115*  GLUCOSE 133*  BUN 52*  CREATININE 0.80   GFR: CrCl cannot be calculated (Unknown ideal weight.). Liver Function Tests: No results for input(s): AST, ALT, ALKPHOS, BILITOT, PROT, ALBUMIN in the last 168 hours. No results for input(s): LIPASE, AMYLASE in the last 168 hours.  Recent Labs Lab 01/23/17 1611  AMMONIA 54*   Coagulation Profile: No results for input(s): INR, PROTIME in the last 168 hours. Cardiac Enzymes: No results for input(s): CKTOTAL, CKMB, CKMBINDEX, TROPONINI in the last 168 hours. BNP (last 3 results) No results for input(s): PROBNP in the last 8760 hours. HbA1C: No results for input(s): HGBA1C in the last 72 hours. CBG: No results for input(s): GLUCAP in the last 168 hours. Lipid Profile: No results for input(s): CHOL, HDL, LDLCALC, TRIG, CHOLHDL, LDLDIRECT in the last 72 hours. Thyroid Function Tests: No results for input(s): TSH, T4TOTAL, FREET4, T3FREE, THYROIDAB  in the last 72 hours. Anemia Panel: No results for input(s): VITAMINB12, FOLATE, FERRITIN, TIBC, IRON, RETICCTPCT in the last 72 hours. Sepsis Labs:  Recent Labs Lab 01/23/17 1520 01/23/17 1947  LATICACIDVEN 2.82* 3.73*    Recent Results (from the past 240 hour(s))  Blood Culture (routine x 2)     Status: None (Preliminary result)   Collection Time: 01/23/17  3:10 PM  Result Value Ref Range Status   Specimen Description BLOOD BLOOD RIGHT FOREARM  Final   Special Requests IN PEDIATRIC BOTTLE Blood Culture adequate volume  Final   Culture NO GROWTH 3 DAYS  Final   Report Status PENDING   Incomplete  Blood Culture (routine x 2)     Status: None (Preliminary result)   Collection Time: 01/23/17  3:35 PM  Result Value Ref Range Status   Specimen Description BLOOD LEFT HAND  Final   Special Requests   Final    BOTTLES DRAWN AEROBIC ONLY Blood Culture adequate volume   Culture NO GROWTH 3 DAYS  Final   Report Status PENDING  Incomplete  Urine culture     Status: Abnormal   Collection Time: 01/23/17  4:43 PM  Result Value Ref Range Status   Specimen Description URINE, CATHETERIZED  Final   Special Requests NONE  Final   Culture MULTIPLE SPECIES PRESENT, SUGGEST RECOLLECTION (A)  Final   Report Status 01/24/2017 FINAL  Final         Radiology Studies: No results found.      Scheduled Meds: Continuous Infusions:   LOS: 4 days      Alwyn Ren, MD [facility]  If 7PM-7AM, please contact night-coverage [email or web address] Password [email or web address] 01/27/2017, 1:40 PM

## 2017-01-27 NOTE — Progress Notes (Signed)
Pt lying in bed with no noted distress. Pt turned and repositioned frequently. Family at bedside. Will continue to monitor.

## 2017-01-28 DIAGNOSIS — A419 Sepsis, unspecified organism: Secondary | ICD-10-CM

## 2017-01-28 LAB — CULTURE, BLOOD (ROUTINE X 2)
CULTURE: NO GROWTH
Culture: NO GROWTH
SPECIAL REQUESTS: ADEQUATE
SPECIAL REQUESTS: ADEQUATE

## 2017-01-28 MED ORDER — MORPHINE SULFATE (CONCENTRATE) 10 MG/0.5ML PO SOLN: 10.0000 mg | ORAL | 0 refills | Status: AC | PRN

## 2017-01-28 MED ORDER — SCOPOLAMINE 1 MG/3DAYS TD PT72: 1.0000 | MEDICATED_PATCH | TRANSDERMAL | 1 refills | Status: AC

## 2017-01-28 MED ORDER — HALOPERIDOL LACTATE 2 MG/ML PO CONC: 1.0000 mg | ORAL | 0 refills | Status: AC | PRN

## 2017-01-28 NOTE — Discharge Summary (Signed)
Physician Discharge Summary  Wanda Manning ZOX:[id number] DOB: Sep 22, 1927 DOA: 01/23/2017  PCP: Crista Curb, MD  Admit date: 01/23/2017 Discharge date: 01/28/2017  Time spent: 20 minutes  Recommendations for Outpatient Follow-up:  1. Pt will need EOL care at [facility] facility  Discharge Diagnoses:  Active Problems:   Stroke (cerebrum) [facility])   Discharge Condition: gaurded  Diet recommendation:  comfort  Filed Weights   01/23/17 1606  Weight: 48.1 kg (106 lb)    History of present illness: 83 year old female with history of end-stage dementia admitted from a nursing home facility with unresponsiveness. CT of the head showed acute large right MCA nonhemorrhagic infarct secondary to possible right ICA occlusion neurology saw the patient and recommended hospice care. Patient appears to be comfortable at this time on nonrebreather mask.    Hospital Course:    Large right MCA infarct continue comfort care and hospice consult. dc oxygen As the patient has survived to be on the initial 48 hours as suspected we will plan for case management input regarding hospice care I have discussed this with her son Rx for meds comp[atible with comfort will be given     Consultations:  none  Discharge Exam: Vitals:   01/28/17 0058 01/28/17 0529  BP: 116/73 108/69  Pulse: (!) 107 (!) 105  Resp: 16 (!) 22  Temp: 98.6 F (37 C) 97.8 F (36.6 C)  SpO2: 93% 94%    General: eomi ncat  Lethargic.  Groaning and moaning Cardiovascular: s1 s2 no m/r/g Respiratory: clear no added sound Extremities warm Work of breathing comfortably but tachypnea pupils dilated  Discharge Instructions   Discharge Instructions    Diet - low sodium heart healthy    Complete by:  As directed    Increase activity slowly    Complete by:  As directed      Current Discharge Medication List    START taking these medications   Details  haloperidol (HALDOL) 2 MG/ML solution Place 0.5 mLs (1 mg total) under  the tongue every 4 (four) hours as needed for agitation (or delirium). Qty: 15 mL, Refills: 0    Morphine Sulfate (MORPHINE CONCENTRATE) 10 MG/0.5ML SOLN concentrated solution Place 0.5 mLs (10 mg total) under the tongue every 3 (three) hours as needed for severe pain or shortness of breath. Qty: 180 mL, Refills: 0    scopolamine (TRANSDERM-SCOP, 1.5 MG,) 1 MG/3DAYS Place 1 patch (1.5 mg total) onto the skin every 3 (three) days. Qty: 4 patch, Refills: 1      CONTINUE these medications which have NOT CHANGED   Details  acetaminophen (TYLENOL) 325 MG tablet Take 650 mg by mouth every 4 (four) hours as needed for mild pain.    omeprazole (PRILOSEC) 20 MG capsule Take 20 mg by mouth daily.      STOP taking these medications     Amino Acids-Protein Hydrolys (FEEDING SUPPLEMENT, PRO-STAT SUGAR FREE 64,) LIQD      Cholecalciferol (VITAMIN D) 2000 units tablet      vitamin B-12 (CYANOCOBALAMIN) 1000 MCG tablet        Not on File    The results of significant diagnostics from this hospitalization (including imaging, microbiology, ancillary and laboratory) are listed below for reference.    Significant Diagnostic Studies: Ct Head Wo Contrast  Addendum Date: 01/23/2017   ADDENDUM REPORT: 01/23/2017 16:23 ADDENDUM: Critical Value/emergent results were called by telephone at the time of interpretation on 01/23/2017 at 4:20 pm to Dr. Homero Fellers [facility] , who verbally acknowledged  these results. Electronically Signed   By: Awilda Metro M.D.   On: 01/23/2017 16:23   Result Date: 01/23/2017 CLINICAL DATA:  Altered mental status. EXAM: CT HEAD WITHOUT CONTRAST TECHNIQUE: Contiguous axial images were obtained from the base of the skull through the vertex without intravenous contrast. COMPARISON:  None. FINDINGS: BRAIN: Blurring of RIGHT frontotemporal in mesial frontoparietal gray-white matter junction with hypodense RIGHT basal ganglia and generalized RIGHT cerebrum sulcal effacement. Confluent LEFT  supratentorial white matter hypodensities. Old LEFT basal ganglia and LEFT thalamus lacunar infarcts. Probable old RIGHT thalamus lacunar infarct. No intraparenchymal hemorrhage, midline shift. No abnormal extra-axial fluid collections. Basal cisterns are patent. VASCULAR: Dense RIGHT MCA. Moderate calcific atherosclerosis carotid siphons. SKULL: No skull fracture. Severe temporomandibular osteoarthrosis. No significant scalp soft tissue swelling. SINUSES/ORBITS: The mastoid air-cells and included paranasal sinuses are well-aerated.The included ocular globes and orbital contents are non-suspicious. Status post bilateral ocular lens implants. OTHER: Patient is edentulous. IMPRESSION: 1. Acute large RIGHT MCA and RIGHT ACA territory nonhemorrhagic infarct. Findings likely secondary to acute RIGHT internal carotid artery occlusion. 2. Severe chronic small vessel ischemic disease and old lacunar infarcts. Electronically Signed: By: Awilda Metro M.D. On: 01/23/2017 16:17   Dg Chest Port 1 View  Result Date: 01/23/2017 CLINICAL DATA:  Altered mental status. EXAM: PORTABLE CHEST 1 VIEW COMPARISON:  None. FINDINGS: The mediastinal contour is normal. The aorta is tortuous. Heart size is enlarged. There is no focal infiltrate, pulmonary edema, or pleural effusion. No acute bone abnormality is identified IMPRESSION: Cardiomegaly.  Lungs are clear. Electronically Signed   By: Sherian Rein M.D.   On: 01/23/2017 15:45    Microbiology: Recent Results (from the past 240 hour(s))  Blood Culture (routine x 2)     Status: None (Preliminary result)   Collection Time: 01/23/17  3:10 PM  Result Value Ref Range Status   Specimen Description BLOOD BLOOD RIGHT FOREARM  Final   Special Requests IN PEDIATRIC BOTTLE Blood Culture adequate volume  Final   Culture NO GROWTH 4 DAYS  Final   Report Status PENDING  Incomplete  Blood Culture (routine x 2)     Status: None (Preliminary result)   Collection Time: 01/23/17  3:35  PM  Result Value Ref Range Status   Specimen Description BLOOD LEFT HAND  Final   Special Requests   Final    BOTTLES DRAWN AEROBIC ONLY Blood Culture adequate volume   Culture NO GROWTH 4 DAYS  Final   Report Status PENDING  Incomplete  Urine culture     Status: Abnormal   Collection Time: 01/23/17  4:43 PM  Result Value Ref Range Status   Specimen Description URINE, CATHETERIZED  Final   Special Requests NONE  Final   Culture MULTIPLE SPECIES PRESENT, SUGGEST RECOLLECTION (A)  Final   Report Status 01/24/2017 FINAL  Final     Labs: Basic Metabolic Panel:  Recent Labs Lab 01/23/17 1947  NA 144  K 3.7  CL 115*  GLUCOSE 133*  BUN 52*  CREATININE 0.80   Liver Function Tests: No results for input(s): AST, ALT, ALKPHOS, BILITOT, PROT, ALBUMIN in the last 168 hours. No results for input(s): LIPASE, AMYLASE in the last 168 hours.  Recent Labs Lab 01/23/17 1611  AMMONIA 54*   CBC:  Recent Labs Lab 01/23/17 1514 01/23/17 1947  WBC 19.3*  --   NEUTROABS 17.1*  --   HGB 10.5* 10.9*  HCT 32.6* 32.0*  MCV 67.5*  --   PLT 175  --  Cardiac Enzymes: No results for input(s): CKTOTAL, CKMB, CKMBINDEX, TROPONINI in the last 168 hours. BNP: BNP (last 3 results) No results for input(s): BNP in the last 8760 hours.  ProBNP (last 3 results) No results for input(s): PROBNP in the last 8760 hours.  CBG: No results for input(s): GLUCAP in the last 168 hours.     SignedRhetta Mura MD   [facility] 01/28/2017, 8:52 AM

## 2017-01-28 NOTE — Care Management Important Message (Signed)
Important Message  Patient Details  Name: Wanda Manning MRN: [id number] Date of Birth: [birth year]   Medicare Important Message Given:  Yes    Vear Staton 01/28/2017, 1:48 PM

## 2017-01-28 NOTE — Progress Notes (Signed)
Pt being discharged from hospital per orders from MD. Pt being transferred to [facility] place. Pt's son made aware of transfer. Pt's IV was removed prior to transfer. RN called to give report to [place]. Pt exited hospital via stretcher.

## 2017-01-28 NOTE — Care Management Note (Signed)
Case Management Note  Patient Details  Name: Wanda Manning MRN: [id number] Date of Birth: 1927-05-01  Subjective/Objective:                    Action/Plan: Plan is for patient to discharge to [facility]. No further needs per CM.  Expected Discharge Date:  01/28/17               Expected Discharge Plan:  Hospice Medical Facility  In-House Referral:  Clinical Social Work  Discharge planning Services     Post Acute Care Choice:    Choice offered to:     DME Arranged:    DME Agency:     HH Arranged:    HH Agency:     Status of Service:  Completed, signed off  If discussed at [employer], dates discussed:    Additional Comments:  Kermit Balo, RN 01/28/2017, 11:19 AM

## 2017-01-28 NOTE — Progress Notes (Addendum)
CSW following to facilitate discharge. CSW has reached out to [facility] representative to see if patient would be able to admit for residential hospice facility today; waiting to hear back.  CSW will update with information when available.  Blenda Nicely, [place] Clinical Social Worker [phone]    UPDATE 12:02 PM:  [facility] bed available and will be able to admit patient; hospice liason is en route to the hospital, will meet with family at bedside around 12:30 for paperwork. Patient's son, Gerlene Burdock, at bedside is aware.   CSW to follow to facilitate discharge to residential hospice.  Blenda Nicely, [place] Clinical Social Worker [phone]

## 2017-01-28 NOTE — Consult Note (Signed)
[facility] [employer]  Received request from [place] for family interest in [facility] transfer today. Chart reviewed and room available today. Met with son Delfino Lovett to complete transfer paper work. Dr. Orpah Melter to assume care. CSW aware.  Discharge summary has been faxed.   RN please call report to [phone].  Thank you,  Erling Conte, LCSW [phone]

## 2017-01-28 NOTE — Progress Notes (Signed)
Discharge to: [facility] Anticipated discharge date: 01/28/17 Family notified: Yes, at bedside Transportation by: [facility]  CSW signing off.  Blenda Nicely LCSW [phone]

## 2017-02-28 DEATH — deceased

## 2019-04-02 IMAGING — CT CT HEAD W/O CM
4 series · 16 of 47 positions shown, 18 images · non-contrast
Comparison: None.

ADDENDUM:
Critical Value/emergent results were called by telephone at the time
of interpretation on 01/23/2017 at [DATE] to Dr. LIZANDRO CHOY , who
verbally acknowledged these results.
CLINICAL DATA: Altered mental status.

EXAM:
CT HEAD WITHOUT CONTRAST
TECHNIQUE: Contiguous axial images were obtained from the base of the skull
through the vertex without intravenous contrast.

[Series 3: head without · axial · non-contrast · 0.46mm/px · z∈[+1168,+1293]mm · 6 of 35 slices shown, 8 images]
[im 5/35  brain]
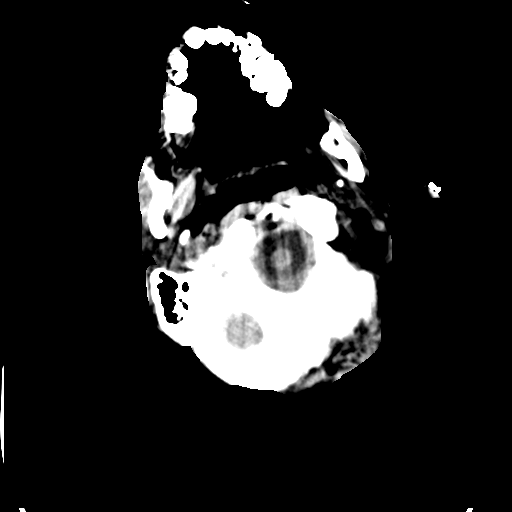
[im 5/35  bone]
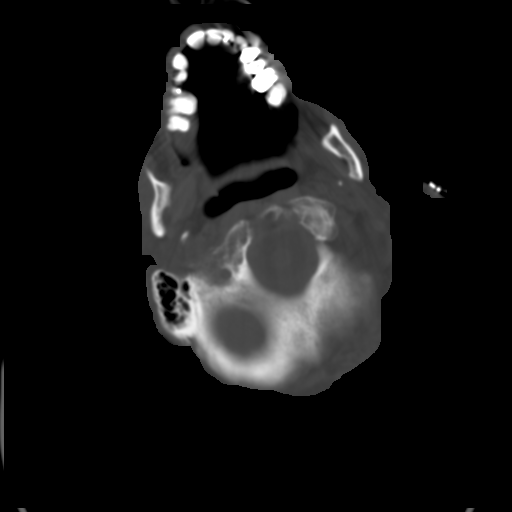
[im 10/35  brain]
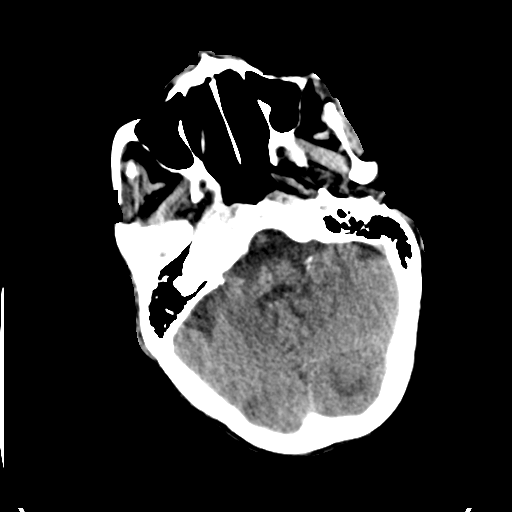
[im 15/35  brain]
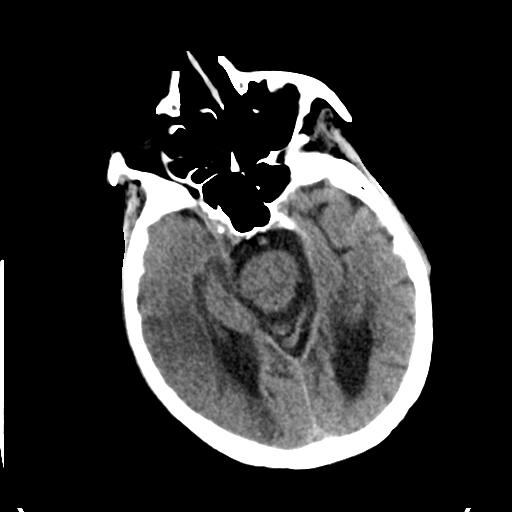
[im 20/35  brain]
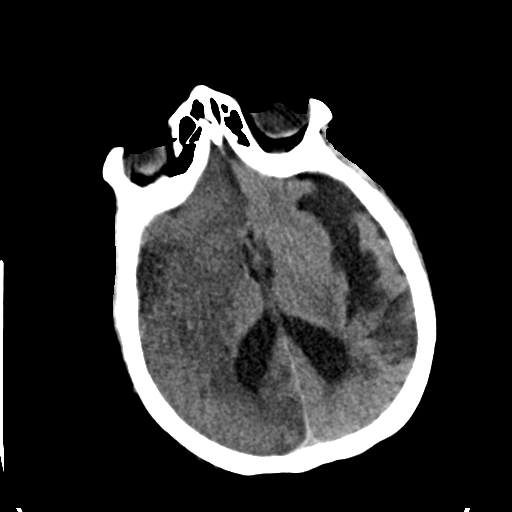
[im 25/35  brain]
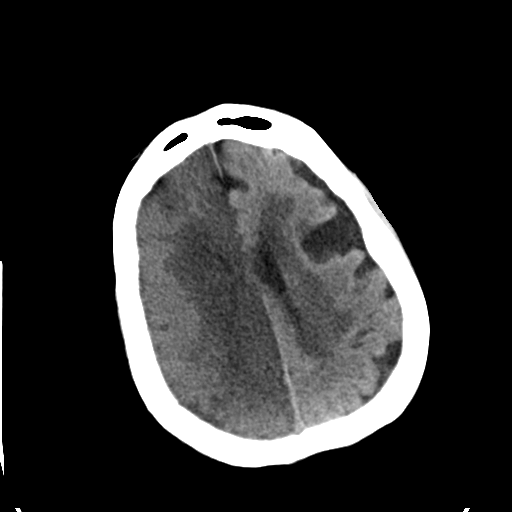
[im 25/35  bone]
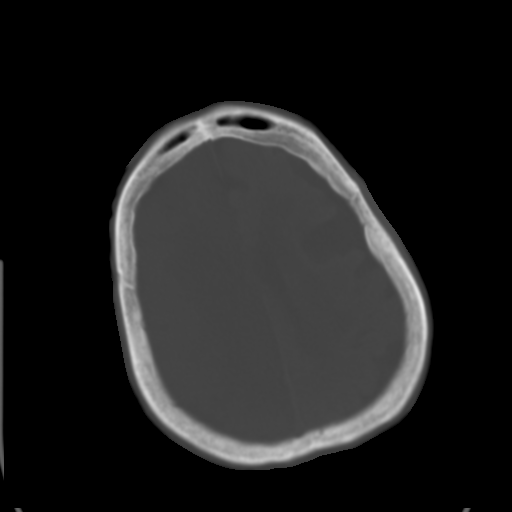
[im 30/35  brain]
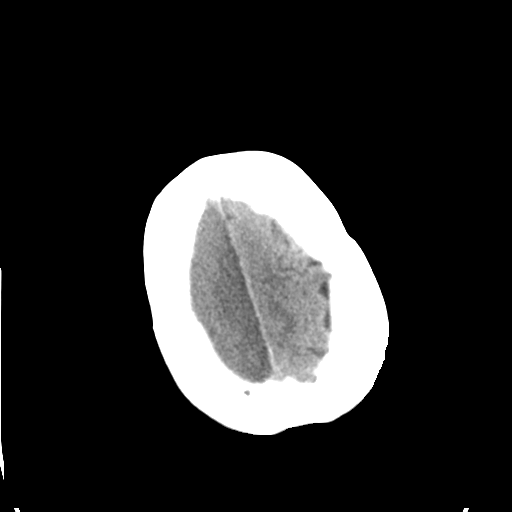

[Series 4: head bone · axial · 0.46mm/px · z∈[+1164,+1224]mm · 4 of 91 slices shown]
[im 9/91  bone]
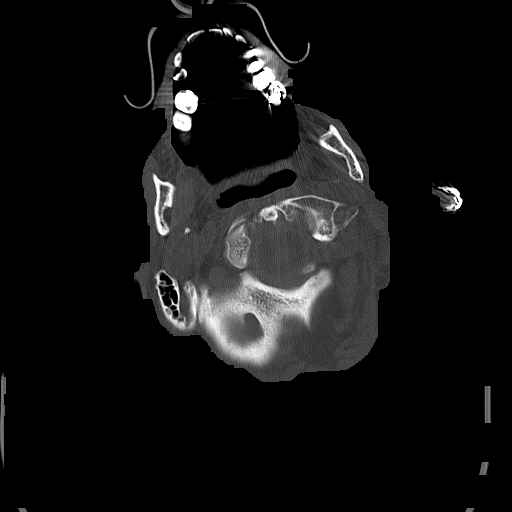
[im 18/91  bone]
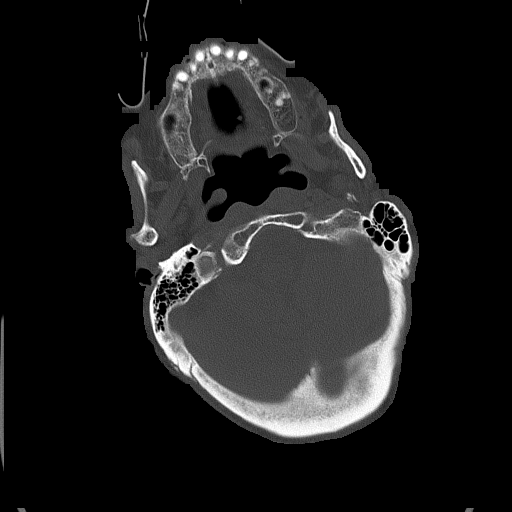
[im 31/91  bone]
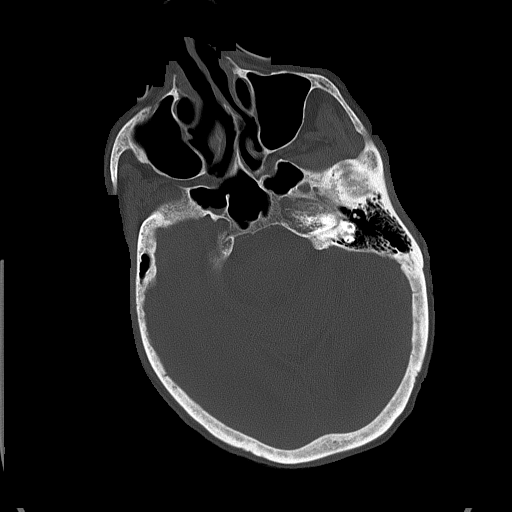
[im 39/91  bone]
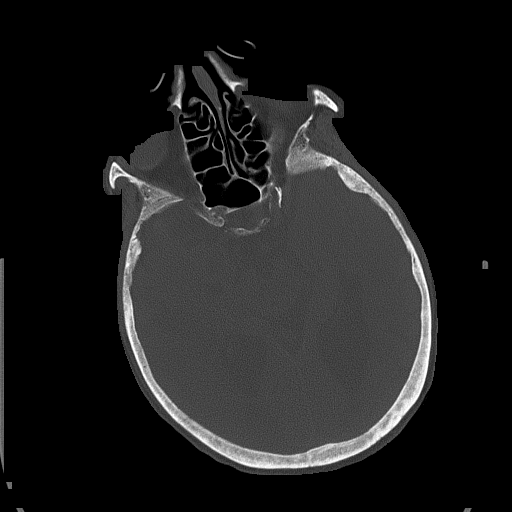

[Series 5: head without cor · coronal · non-contrast · 0.34mm/px · 3 of 74 slices shown]
[im 25/74  brain]
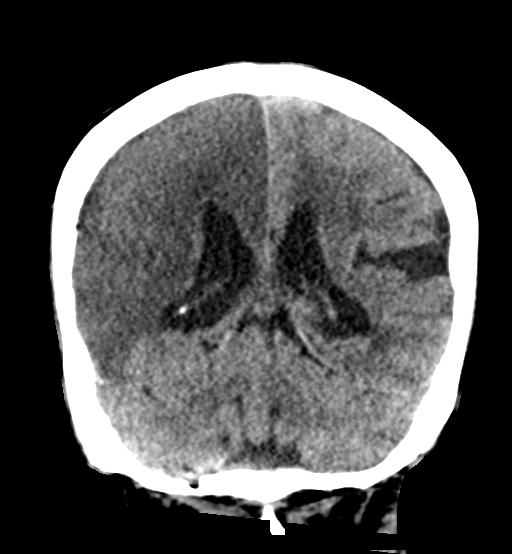
[im 33/74  brain]
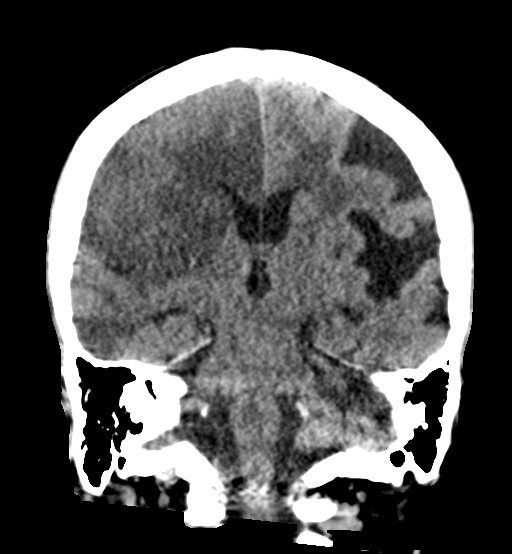
[im 41/74  brain]
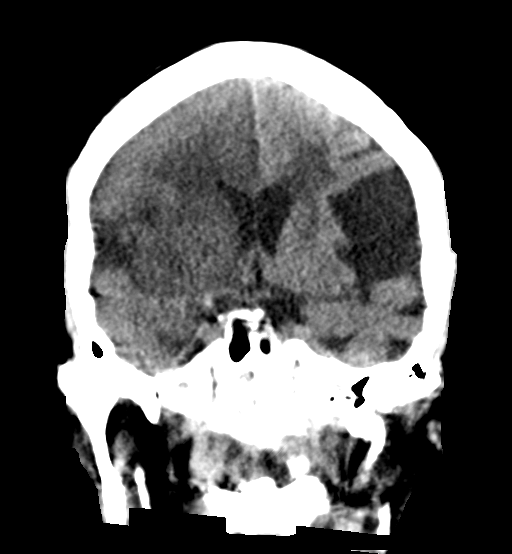

[Series 7: head without sag 1 · sagittal · non-contrast · 0.35mm/px · 3 of 54 slices shown]
[im 21/54  brain]
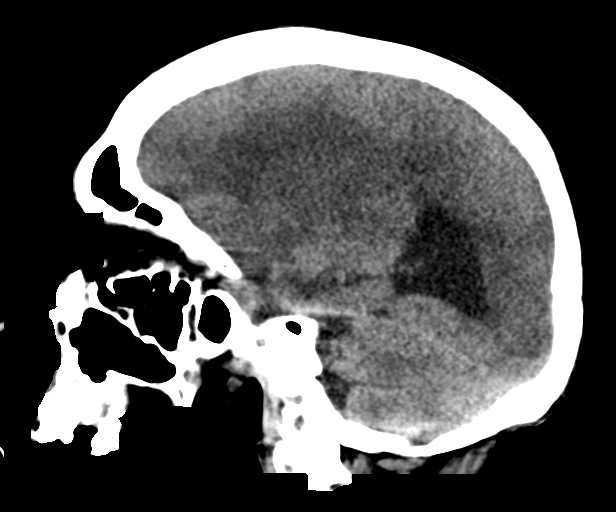
[im 27/54  brain]
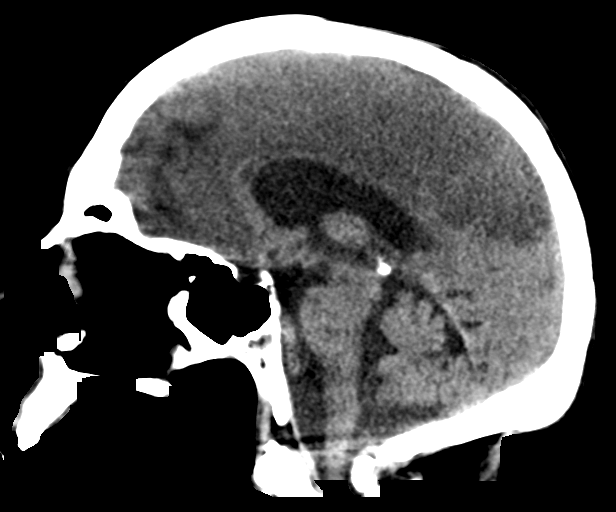
[im 34/54  brain]
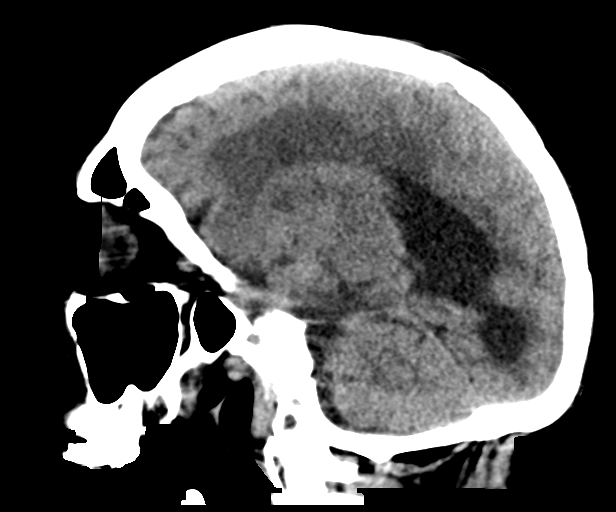

[16 of 47 positions shown; findings below may reference images not displayed]

FINDINGS: BRAIN: Blurring of RIGHT frontotemporal in mesial frontoparietal
gray-white matter junction with hypodense RIGHT basal ganglia and
generalized RIGHT cerebrum sulcal effacement. Confluent LEFT
supratentorial white matter hypodensities. Old LEFT basal ganglia
and LEFT thalamus lacunar infarcts. Probable old RIGHT thalamus
lacunar infarct. No intraparenchymal hemorrhage, midline shift. No
abnormal extra-axial fluid collections. Basal cisterns are patent.

VASCULAR: Dense RIGHT MCA. Moderate calcific atherosclerosis carotid
siphons.

SKULL: No skull fracture. Severe temporomandibular osteoarthrosis.
No significant scalp soft tissue swelling.

SINUSES/ORBITS: The mastoid air-cells and included paranasal sinuses
are well-aerated.The included ocular globes and orbital contents are
non-suspicious. Status post bilateral ocular lens implants.

OTHER: Patient is edentulous.
IMPRESSION: 1. Acute large RIGHT MCA and RIGHT ACA territory nonhemorrhagic
infarct. Findings likely secondary to acute RIGHT internal carotid
artery occlusion.
2. Severe chronic small vessel ischemic disease and old lacunar
infarcts.

## 2019-04-02 IMAGING — DX DG CHEST 1V PORT
1 series · 1 of 1 positions shown · non-contrast
Comparison: None.

CLINICAL DATA: Altered mental status.

EXAM:
PORTABLE CHEST 1 VIEW

[chest ap]
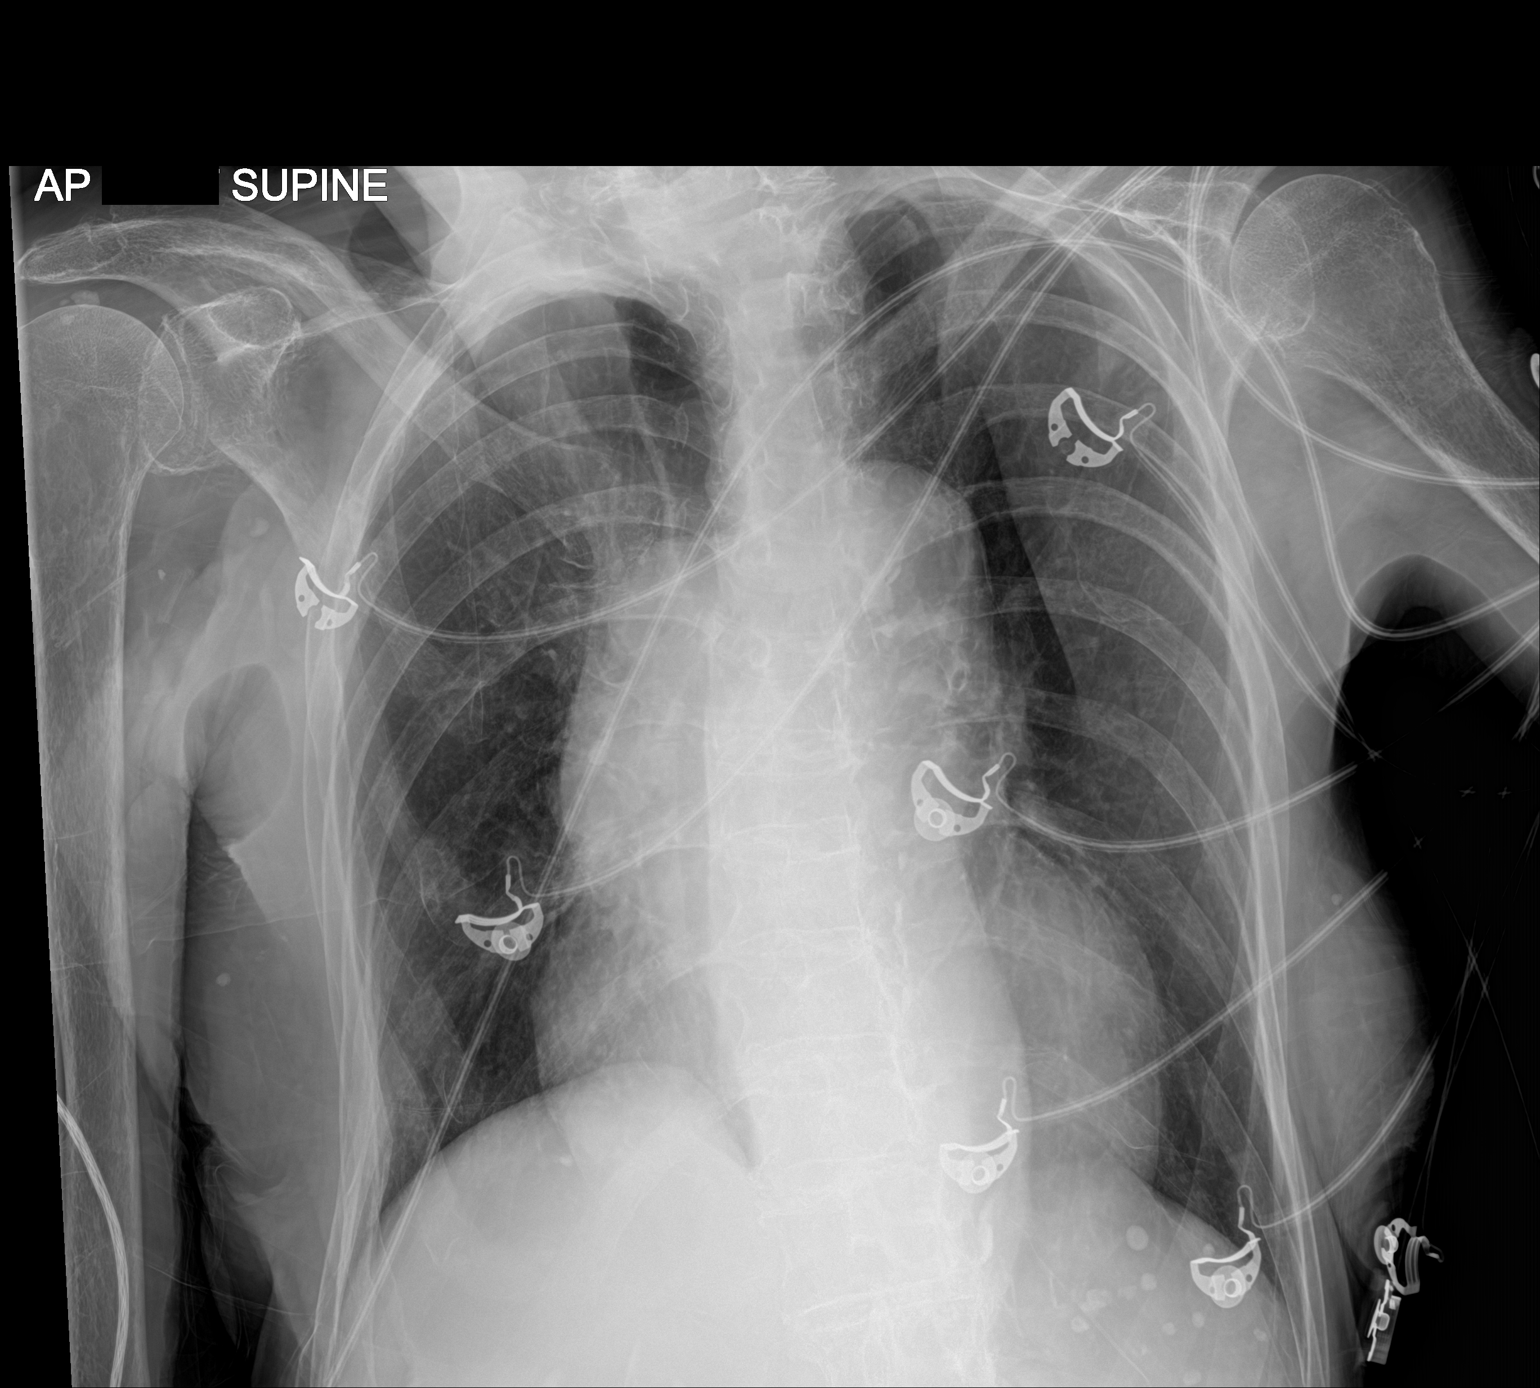

[1 of 1 positions shown; findings below may reference images not displayed]

FINDINGS: The mediastinal contour is normal. The aorta is tortuous. Heart size
is enlarged. There is no focal infiltrate, pulmonary edema, or
pleural effusion. No acute bone abnormality is identified
IMPRESSION: Cardiomegaly.  Lungs are clear.
